# Patient Record
Sex: Male | Born: 1970 | Race: White | Hispanic: No | Marital: Married | Smoking: Never smoker
Health system: Southern US, Community
[De-identification: ages and names within clinical notes are randomized; demographics above are authoritative.]

## PROBLEM LIST (undated history)

## (undated) DIAGNOSIS — E119 Type 2 diabetes mellitus without complications: Secondary | ICD-10-CM

## (undated) DIAGNOSIS — J45909 Unspecified asthma, uncomplicated: Secondary | ICD-10-CM

---

## 2000-08-06 ENCOUNTER — Emergency Department (HOSPITAL_COMMUNITY): Admission: EM | Admit: 2000-08-06 | Discharge: 2000-08-06 | Payer: Self-pay | Admitting: Emergency Medicine

## 2000-08-06 ENCOUNTER — Encounter: Payer: Self-pay | Admitting: Emergency Medicine

## 2012-05-02 ENCOUNTER — Emergency Department (HOSPITAL_COMMUNITY)
Admission: EM | Admit: 2012-05-02 | Discharge: 2012-05-02 | Disposition: A | Discharge status: Home / Self Care | Payer: Worker's Compensation | Attending: Emergency Medicine | Admitting: Emergency Medicine

## 2012-05-02 ENCOUNTER — Emergency Department (HOSPITAL_COMMUNITY): Payer: Worker's Compensation

## 2012-05-02 ENCOUNTER — Encounter (HOSPITAL_COMMUNITY): Payer: Self-pay | Admitting: Emergency Medicine

## 2012-05-02 DIAGNOSIS — W010XXA Fall on same level from slipping, tripping and stumbling without subsequent striking against object, initial encounter: Secondary | ICD-10-CM | POA: Insufficient documentation

## 2012-05-02 DIAGNOSIS — Y9289 Other specified places as the place of occurrence of the external cause: Secondary | ICD-10-CM | POA: Insufficient documentation

## 2012-05-02 DIAGNOSIS — Y9389 Activity, other specified: Secondary | ICD-10-CM | POA: Insufficient documentation

## 2012-05-02 DIAGNOSIS — Y990 Civilian activity done for income or pay: Secondary | ICD-10-CM | POA: Insufficient documentation

## 2012-05-02 DIAGNOSIS — S0993XA Unspecified injury of face, initial encounter: Secondary | ICD-10-CM | POA: Insufficient documentation

## 2012-05-02 DIAGNOSIS — S0180XA Unspecified open wound of other part of head, initial encounter: Secondary | ICD-10-CM | POA: Insufficient documentation

## 2012-05-02 DIAGNOSIS — E119 Type 2 diabetes mellitus without complications: Secondary | ICD-10-CM | POA: Insufficient documentation

## 2012-05-02 DIAGNOSIS — J45909 Unspecified asthma, uncomplicated: Secondary | ICD-10-CM | POA: Insufficient documentation

## 2012-05-02 DIAGNOSIS — W1809XA Striking against other object with subsequent fall, initial encounter: Secondary | ICD-10-CM | POA: Insufficient documentation

## 2012-05-02 DIAGNOSIS — S79919A Unspecified injury of unspecified hip, initial encounter: Secondary | ICD-10-CM | POA: Insufficient documentation

## 2012-05-02 DIAGNOSIS — Z79899 Other long term (current) drug therapy: Secondary | ICD-10-CM | POA: Insufficient documentation

## 2012-05-02 HISTORY — DX: Type 2 diabetes mellitus without complications: E11.9

## 2012-05-02 HISTORY — DX: Unspecified asthma, uncomplicated: J45.909

## 2012-05-02 LAB — URINALYSIS, ROUTINE W REFLEX MICROSCOPIC
Bilirubin Urine: NEGATIVE
Glucose, UA: >1000 mg/dL — AB
Hgb urine dipstick: NEGATIVE
Ketones, ur: NEGATIVE mg/dL
pH: 5 (ref 5.0–8.0)

## 2012-05-02 LAB — RAPID URINE DRUG SCREEN, HOSP PERFORMED
Barbiturates: NOT DETECTED
Benzodiazepines: NOT DETECTED
Cocaine: NOT DETECTED
Tetrahydrocannabinol: NOT DETECTED

## 2012-05-02 LAB — CBC WITH DIFFERENTIAL/PLATELET
Basophils Absolute: 0.1 10*3/uL (ref 0.0–0.1)
Basophils Relative: 1 % (ref 0–1)
Eosinophils Absolute: 0.3 10*3/uL (ref 0.0–0.7)
Eosinophils Relative: 5 % (ref 0–5)
HCT: 40.5 % (ref 39.0–52.0)
Hemoglobin: 14 g/dL (ref 13.0–17.0)
Lymphocytes Relative: 38 % (ref 12–46)
Lymphs Abs: 2.7 10*3/uL (ref 0.7–4.0)
MCH: 31 pg (ref 26.0–34.0)
MCHC: 34.6 g/dL (ref 30.0–36.0)
MCV: 89.8 fL (ref 78.0–100.0)
Monocytes Absolute: 0.4 10*3/uL (ref 0.1–1.0)
Monocytes Relative: 6 % (ref 3–12)
Neutro Abs: 3.6 10*3/uL (ref 1.7–7.7)
Neutrophils Relative %: 51 % (ref 43–77)
Platelets: 216 10*3/uL (ref 150–400)
RBC: 4.51 MIL/uL (ref 4.22–5.81)
RDW: 12.9 % (ref 11.5–15.5)
WBC: 7.1 10*3/uL (ref 4.0–10.5)

## 2012-05-02 LAB — POCT I-STAT, CHEM 8
BUN: 11 mg/dL (ref 6–23)
Calcium, Ion: 1.24 mmol/L — ABNORMAL HIGH (ref 1.12–1.23)
Chloride: 106 mEq/L (ref 96–112)
Glucose, Bld: 117 mg/dL — ABNORMAL HIGH (ref 70–99)

## 2012-05-02 MED ORDER — LIDOCAINE HCL 2 % IJ SOLN
5.0000 mL | Freq: Once | INTRAMUSCULAR | Status: AC
  Administered 2012-05-02: 100 mg

## 2012-05-02 MED ORDER — HYDROCODONE-ACETAMINOPHEN 5-325 MG PO TABS
1.0000 | ORAL_TABLET | Freq: Once | ORAL | Status: AC
  Administered 2012-05-02: 1 via ORAL
  Filled 2012-05-02: qty 1

## 2012-05-02 MED ORDER — HYDROCODONE-ACETAMINOPHEN 5-325 MG PO TABS: 1.0000 | ORAL_TABLET | ORAL | Status: DC | PRN

## 2012-05-02 MED ORDER — ACETAMINOPHEN 500 MG PO TABS
1000.0000 mg | ORAL_TABLET | Freq: Once | ORAL | Status: AC
  Administered 2012-05-02: 1000 mg via ORAL
  Filled 2012-05-02: qty 2

## 2012-05-02 MED ORDER — SODIUM CHLORIDE 0.9 % IV SOLN
Freq: Once | INTRAVENOUS | Status: AC
  Administered 2012-05-02: 15:00:00 via INTRAVENOUS

## 2012-05-02 NOTE — ED Notes (Signed)
Workers comp information given to registration.

## 2012-05-02 NOTE — ED Notes (Signed)
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

## 2012-05-02 NOTE — ED Notes (Signed)
Suture cart at bedside; notified Dr Ignacia Palma that pts manager called and requested at urine drug screen for insurance purposes.

## 2012-05-02 NOTE — ED Provider Notes (Addendum)
History     CSN: [id number]  Arrival date & time 05/02/12  1507   None     Chief Complaint  Patient presents with  . Fall    (Consider location/radiation/quality/duration/timing/severity/associated sxs/prior treatment) Patient is a 42 y.o. male presenting with fall. The history is provided by the patient and the EMS personnel. No language interpreter was used.  Fall The accident occurred less than 1 hour ago. Fall occurred: Pt was standing on the bed of a truck, slipped because of wetness, fell, hitting forehead on a fork lift, suffering a laceration of the forehead.  He was ambulatory at the scene. He fell from a height of 3 to 5 ft. He landed on dirt. The volume of blood lost was minimal. The point of impact was the head. The pain is present in the head and left hip. The pain is moderate. He was ambulatory at the scene. There was no entrapment after the fall. There was no drug use involved in the accident. There was no alcohol use involved in the accident. Associated symptoms include headaches and loss of consciousness. Pertinent negatives include no fever, no numbness, no nausea, no vomiting and no tingling. Exacerbated by: Nothing. Treatment on scene includes a c-collar and a backboard. He has tried immobilization for the symptoms. The treatment provided no relief.    Past Medical History  Diagnosis Date  . Asthma   . Diabetes mellitus without complication     History reviewed. No pertinent past surgical history.  History reviewed. No pertinent family history.  History  Substance Use Topics  . Smoking status: Never Smoker   . Smokeless tobacco: Not on file  . Alcohol Use: No      Review of Systems  Constitutional: Negative for fever and chills.  HENT: Positive for neck pain.   Cardiovascular: Negative.   Gastrointestinal: Negative for nausea and vomiting.  Genitourinary: Negative.   Musculoskeletal:       Pain in left hip and left upper thigh.  Skin: Positive for  wound.  Neurological: Positive for loss of consciousness and headaches. Negative for tingling and numbness.  Psychiatric/Behavioral: Negative.     Allergies  Review of patient's allergies indicates no known allergies.  Home Medications  No current outpatient prescriptions on file.  There were no vitals taken for this visit.  Physical Exam  Nursing note and vitals reviewed. Constitutional: He is oriented to person, place, and time. He appears well-developed and well-nourished.  In mild-moderated distress, complaining of headache, neck pain, pain in left hip and left upper thigh.  Immobilized with C-collar and backboard.  HENT:  Right Ear: External ear normal.  Left Ear: External ear normal.  Mouth/Throat: Oropharynx is clear and moist.  He has a 2 cm laceration on the upper forehead in the midline that runs into the scalp.  No foreign body, no palpable deformity of the skull.  Eyes: Conjunctivae and EOM are normal. Pupils are equal, round, and reactive to light. No scleral icterus.  Neck: Normal range of motion. Neck supple.  No palpable deformity of the spine, no paraspinous muscle spasm.  Pt taken off the backboard and C-collar by me.  Cardiovascular: Normal rate, regular rhythm and normal heart sounds.   Pulmonary/Chest: Effort normal and breath sounds normal.  Abdominal: Soft. Bowel sounds are normal.  Musculoskeletal: Normal range of motion.  No bony defomity or tenderness over the thoracic or lumbosacral spine.  Pain localized to left upper thigh, but no bony deformity on inspection or palpation.  Neurological: He is alert and oriented to person, place, and time.  No sensory or motor deficit.  Skin: Skin is warm and dry.  Psychiatric: He has a normal mood and affect. His behavior is normal.    ED Course  LACERATION REPAIR Date/Time: 05/02/2012 6:06 PM Performed by: Osvaldo Human Authorized by: Osvaldo Human Consent: Verbal consent obtained. Risks and benefits:  risks, benefits and alternatives were discussed Consent given by: patient Patient understanding: patient states understanding of the procedure being performed Patient consent: the patient's understanding of the procedure matches consent given Site marked: the operative site was not marked Patient identity confirmed: verbally with patient Time out: Immediately prior to procedure a "time out" was called to verify the correct patient, procedure, equipment, support staff and site/side marked as required. Body area: head/neck Location details: forehead Laceration length: 2 cm Foreign bodies: no foreign bodies Tendon involvement: none Nerve involvement: none Vascular damage: no Anesthesia: local infiltration Local anesthetic: lidocaine 2% without epinephrine Patient sedated: no Preparation: Patient was prepped and draped in the usual sterile fashion. Irrigation solution: saline Irrigation method: tap Amount of cleaning: standard Debridement: none Degree of undermining: none Skin closure: 6-0 nylon Number of sutures: 6 Technique: simple Approximation: loose Approximation difficulty: simple Patient tolerance: Patient tolerated the procedure well with no immediate complications.   (including critical care time)  Labs Reviewed  CBC WITH DIFFERENTIAL  URINALYSIS, ROUTINE W REFLEX MICROSCOPIC   3:22 PM Pt was seen and had physical examination.  He requested po Tylenol.  Lab and x-rays ordered.  4:33 PM Results for orders placed during the hospital encounter of 05/02/12  CBC WITH DIFFERENTIAL      Result Value Range   WBC 7.1  4.0 - 10.5 K/uL   RBC 4.51  4.22 - 5.81 MIL/uL   Hemoglobin 14.0  13.0 - 17.0 g/dL   HCT 16.1  09.6 - 04.5 %   MCV 89.8  78.0 - 100.0 fL   MCH 31.0  26.0 - 34.0 pg   MCHC 34.6  30.0 - 36.0 g/dL   RDW 40.9  81.1 - 91.4 %   Platelets 216  150 - 400 K/uL   Neutrophils Relative 51  43 - 77 %   Neutro Abs 3.6  1.7 - 7.7 K/uL   Lymphocytes Relative 38  12 -  46 %   Lymphs Abs 2.7  0.7 - 4.0 K/uL   Monocytes Relative 6  3 - 12 %   Monocytes Absolute 0.4  0.1 - 1.0 K/uL   Eosinophils Relative 5  0 - 5 %   Eosinophils Absolute 0.3  0.0 - 0.7 K/uL   Basophils Relative 1  0 - 1 %   Basophils Absolute 0.1  0.0 - 0.1 K/uL  POCT I-STAT, CHEM 8      Result Value Range   Sodium 141  135 - 145 mEq/L   Potassium 4.0  3.5 - 5.1 mEq/L   Chloride 106  96 - 112 mEq/L   BUN 11  6 - 23 mg/dL   Creatinine, Ser 7.82  0.50 - 1.35 mg/dL   Glucose, Bld 956 (*) 70 - 99 mg/dL   Calcium, Ion 2.13 (*) 1.12 - 1.23 mmol/L   TCO2 25  0 - 100 mmol/L   Hemoglobin 13.9  13.0 - 17.0 g/dL   HCT 08.6  57.8 - 46.9 %   Dg Hip Complete Left  05/02/2012  *RADIOLOGY REPORT*  Clinical Data: Pain post fall  LEFT HIP -  COMPLETE 2+ VIEW  Comparison: None.  Findings: Three views of the left hip submitted.  No acute fracture or subluxation.  No radiopaque foreign body.  IMPRESSION: No acute fracture or subluxation.   Original Report Authenticated By: Natasha Mead, M.D.    Ct Head Wo Contrast  05/02/2012  *RADIOLOGY REPORT*  Clinical Data:  Fall from truck.  Transit loss of consciousness. Trauma to forehead.  CT HEAD WITHOUT CONTRAST CT CERVICAL SPINE WITHOUT CONTRAST  Technique:  Multidetector CT imaging of the head and cervical spine was performed following the standard protocol without intravenous contrast.  Multiplanar CT image reconstructions of the cervical spine were also generated.  Comparison:   None  CT HEAD  Findings: Soft tissue swelling and small laceration is present in the right to frontal scalp.  There is no underlying fracture.  No acute cortical infarct, hemorrhage, or mass lesion is present. The ventricles are of normal size.  No significant extra-axial fluid collection is present.  Mild mucosal thickening is scattered throughout the ethmoid air cells.  A fluid level is present in the maxillary sinuses bilaterally, more promptly on the left.  There are small fluid levels in  the frontal sinuses bilaterally.  Mastoid air cells are clear.  Mucosal thickening is present in the left sphenoid sinus.  IMPRESSION:  1.  Normal CT appearance of the brain. 2.  Right frontal scalp soft tissue swelling and laceration. 3.  No underlying fracture. 4.  Acute sinusitis.  CT CERVICAL SPINE  Findings: The cervical spine is imaged from skull base through T1- 2.  The vertebral body heights and alignment maintained.  No acute fracture or traumatic subluxation is evident.  There is straightening of the normal cervical lordosis.  The soft tissues are unremarkable.  The lung apices are clear.  IMPRESSION:  1.  No acute fracture or traumatic subluxation. 2.  Straightening of the normal cervical lordosis.  This is nonspecific, but can be seen in the setting of muscle strain or ongoing pain.   Original Report Authenticated By: Marin Roberts, M.D.    Ct Cervical Spine Wo Contrast  05/02/2012  *RADIOLOGY REPORT*  Clinical Data:  Fall from truck.  Transit loss of consciousness. Trauma to forehead.  CT HEAD WITHOUT CONTRAST CT CERVICAL SPINE WITHOUT CONTRAST  Technique:  Multidetector CT imaging of the head and cervical spine was performed following the standard protocol without intravenous contrast.  Multiplanar CT image reconstructions of the cervical spine were also generated.  Comparison:   None  CT HEAD  Findings: Soft tissue swelling and small laceration is present in the right to frontal scalp.  There is no underlying fracture.  No acute cortical infarct, hemorrhage, or mass lesion is present. The ventricles are of normal size.  No significant extra-axial fluid collection is present.  Mild mucosal thickening is scattered throughout the ethmoid air cells.  A fluid level is present in the maxillary sinuses bilaterally, more promptly on the left.  There are small fluid levels in the frontal sinuses bilaterally.  Mastoid air cells are clear.  Mucosal thickening is present in the left sphenoid sinus.   IMPRESSION:  1.  Normal CT appearance of the brain. 2.  Right frontal scalp soft tissue swelling and laceration. 3.  No underlying fracture. 4.  Acute sinusitis.  CT CERVICAL SPINE  Findings: The cervical spine is imaged from skull base through T1- 2.  The vertebral body heights and alignment maintained.  No acute fracture or traumatic subluxation is evident.  There is  straightening of the normal cervical lordosis.  The soft tissues are unremarkable.  The lung apices are clear.  IMPRESSION:  1.  No acute fracture or traumatic subluxation. 2.  Straightening of the normal cervical lordosis.  This is nonspecific, but can be seen in the setting of muscle strain or ongoing pain.   Original Report Authenticated By: Marin Roberts, M.D.    4:33 PM Lab and x-rays showed no serious injury.  Will suture his forehead laceration.   1. Fall   2. Laceration of forehead             Carleene Cooper III, MD 05/02/12 1511     Carleene Cooper III, MD 05/02/12 [phone]

## 2013-07-08 ENCOUNTER — Emergency Department (HOSPITAL_COMMUNITY)
Admission: EM | Admit: 2013-07-08 | Discharge: 2013-07-08 | Disposition: A | Discharge status: Home / Self Care | Payer: Worker's Compensation | Attending: Emergency Medicine | Admitting: Emergency Medicine

## 2013-07-08 ENCOUNTER — Emergency Department (HOSPITAL_COMMUNITY): Payer: Worker's Compensation

## 2013-07-08 ENCOUNTER — Encounter (HOSPITAL_COMMUNITY): Payer: Self-pay | Admitting: Emergency Medicine

## 2013-07-08 DIAGNOSIS — W1789XA Other fall from one level to another, initial encounter: Secondary | ICD-10-CM | POA: Insufficient documentation

## 2013-07-08 DIAGNOSIS — R209 Unspecified disturbances of skin sensation: Secondary | ICD-10-CM | POA: Insufficient documentation

## 2013-07-08 DIAGNOSIS — IMO0002 Reserved for concepts with insufficient information to code with codable children: Secondary | ICD-10-CM | POA: Insufficient documentation

## 2013-07-08 DIAGNOSIS — Y990 Civilian activity done for income or pay: Secondary | ICD-10-CM | POA: Insufficient documentation

## 2013-07-08 DIAGNOSIS — J45909 Unspecified asthma, uncomplicated: Secondary | ICD-10-CM | POA: Insufficient documentation

## 2013-07-08 DIAGNOSIS — Y9389 Activity, other specified: Secondary | ICD-10-CM | POA: Insufficient documentation

## 2013-07-08 DIAGNOSIS — S139XXA Sprain of joints and ligaments of unspecified parts of neck, initial encounter: Secondary | ICD-10-CM | POA: Insufficient documentation

## 2013-07-08 DIAGNOSIS — E119 Type 2 diabetes mellitus without complications: Secondary | ICD-10-CM | POA: Insufficient documentation

## 2013-07-08 DIAGNOSIS — W1809XA Striking against other object with subsequent fall, initial encounter: Secondary | ICD-10-CM | POA: Insufficient documentation

## 2013-07-08 DIAGNOSIS — S161XXA Strain of muscle, fascia and tendon at neck level, initial encounter: Secondary | ICD-10-CM

## 2013-07-08 DIAGNOSIS — Z79899 Other long term (current) drug therapy: Secondary | ICD-10-CM | POA: Insufficient documentation

## 2013-07-08 DIAGNOSIS — W19XXXA Unspecified fall, initial encounter: Secondary | ICD-10-CM

## 2013-07-08 DIAGNOSIS — S46912A Strain of unspecified muscle, fascia and tendon at shoulder and upper arm level, left arm, initial encounter: Secondary | ICD-10-CM

## 2013-07-08 DIAGNOSIS — Y9289 Other specified places as the place of occurrence of the external cause: Secondary | ICD-10-CM | POA: Insufficient documentation

## 2013-07-08 MED ORDER — DIAZEPAM 5 MG PO TABS: 5.0000 mg | ORAL_TABLET | Freq: Two times a day (BID) | ORAL | Status: DC

## 2013-07-08 MED ORDER — OXYCODONE-ACETAMINOPHEN 5-325 MG PO TABS
1.0000 | ORAL_TABLET | Freq: Once | ORAL | Status: AC
  Administered 2013-07-08: 1 via ORAL
  Filled 2013-07-08: qty 1

## 2013-07-08 MED ORDER — HYDROCODONE-ACETAMINOPHEN 5-325 MG PO TABS: 1.0000 | ORAL_TABLET | ORAL | Status: DC | PRN

## 2013-07-08 MED ORDER — IBUPROFEN 800 MG PO TABS: 800.0000 mg | ORAL_TABLET | Freq: Three times a day (TID) | ORAL | Status: AC

## 2013-07-08 NOTE — Discharge Instructions (Signed)
Take Vicodin for severe pain only. No driving or operating heavy machinery while taking vicodin. This medication may cause drowsiness. Take Valium as needed as directed for muscle spasm. No driving or operating heavy machinery while taking valium. This medication may cause drowsiness. Take ibuprofen as directed as needed for pain. Rest, avoid heavy lifting or hard physical activity for the next few days. Ice your neck and shoulder for the next 24 hours followed by heat thereafter, 15 minutes at a time.  Muscle Strain A muscle strain is an injury that occurs when a muscle is stretched beyond its normal length. Usually a small number of muscle fibers are torn when this happens. Muscle strain is rated in degrees. First-degree strains have the least amount of muscle fiber tearing and pain. Second-degree and third-degree strains have increasingly more tearing and pain.  Usually, recovery from muscle strain takes 1 2 weeks. Complete healing takes 5 6 weeks.  CAUSES  Muscle strain happens when a sudden, violent force placed on a muscle stretches it too far. This may occur with lifting, sports, or a fall.  RISK FACTORS Muscle strain is especially common in athletes.  SIGNS AND SYMPTOMS At the site of the muscle strain, there may be:  Pain.  Bruising.  Swelling.  Difficulty using the muscle due to pain or lack of normal function. DIAGNOSIS  Your health care provider will perform a physical exam and ask about your medical history. TREATMENT  Often, the best treatment for a muscle strain is resting, icing, and applying cold compresses to the injured area.  HOME CARE INSTRUCTIONS   Use the PRICE method of treatment to promote muscle healing during the first 2 3 days after your injury. The PRICE method involves:  Protecting the muscle from being injured again.  Restricting your activity and resting the injured body part.  Icing your injury. To do this, put ice in a plastic bag. Place a towel  between your skin and the bag. Then, apply the ice and leave it on from 15 20 minutes each hour. After the third day, switch to moist heat packs.  Apply compression to the injured area with a splint or elastic bandage. Be careful not to wrap it too tightly. This may interfere with blood circulation or increase swelling.  Elevate the injured body part above the level of your heart as often as you can.  Only take over-the-counter or prescription medicines for pain, discomfort, or fever as directed by your health care provider.  Warming up prior to exercise helps to prevent future muscle strains. SEEK MEDICAL CARE IF:   You have increasing pain or swelling in the injured area.  You have numbness, tingling, or a significant loss of strength in the injured area. MAKE SURE YOU:   Understand these instructions.  Will watch your condition.  Will get help right away if you are not doing well or get worse. Document Released: 02/26/2005 Document Revised: 12/17/2012 Document Reviewed: 09/25/2012 [facility] Patient Information 2014 [employer], [place].

## 2013-07-08 NOTE — ED Notes (Addendum)
Pt was standing on 3 feet high platform at work when the hose he was working with pulled him back and he fell hitting the back of his head and his left shoulder. Pt sts his neck hurts and pain radiates down his arm. Pt repots left hand is numb. Distal pulses palpable.

## 2013-07-08 NOTE — ED Provider Notes (Signed)
CSN: [id number]     Arrival date & time 07/08/13  1247 History  This chart was scribed for non-physician practitioner, [name], working with Hilario Quarryanielle S Ray, MD, by Tana ConchStephen Methvin ED Scribe. This patient was seen in [facility] and the patient's care was started at 12:56 PM.    Chief Complaint  Patient presents with  . Fall  . Neck Pain  . Shoulder Pain      Patient is a 43 y.o. male presenting with fall, neck pain, and shoulder pain. The history is provided by the patient. No language interpreter was used.  Fall  Neck Pain Associated symptoms: numbness (left hand)   Shoulder Pain    HPI Comments: William MoodBobby Wilkinson is a 43 y.o. 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 He was knocked off the the oil tank and now c/o neck pain, left shoulder pain. He states that the pain is a 10/10 and constant. He reports that his left hand is numb and that the numbness has "improved since he has been here". He denies LOC, visual disturbance, and HA   Past Medical History  Diagnosis Date  . Asthma   . Diabetes mellitus without complication    History reviewed. No pertinent past surgical history. History reviewed. No pertinent family history. History  Substance Use Topics  . Smoking status: Never Smoker   . Smokeless tobacco: Not on file  . Alcohol Use: No    Review of Systems  Musculoskeletal: Positive for arthralgias, joint swelling and neck pain.  Neurological: Positive for numbness (left hand).  All other systems reviewed and are negative.     Allergies  Review of patient's allergies indicates no known allergies.  Home Medications   Prior to Admission medications   Medication Sig Start Date End Date Taking? Authorizing Provider  Fluticasone-Salmeterol (ADVAIR) 250-50 MCG/DOSE AEPB Inhale 2 puffs into the lungs 2 (two) times daily.    Historical Provider, MD   HYDROcodone-acetaminophen (NORCO/VICODIN) 5-325 MG per tablet Take 1 tablet by mouth every 4 (four) hours as needed for pain. 05/02/12   Carleene CooperAlan Davidson III, MD  lisinopril (PRINIVIL,ZESTRIL) 5 MG tablet Take 5 mg by mouth daily.    Historical Provider, MD  metFORMIN (GLUCOPHAGE) 500 MG tablet Take 500 mg by mouth 2 (two) times daily with a meal.    Historical Provider, MD   BP 136/94  Pulse 101  Resp 22  Ht 6' (1.829 m)  Wt 260 lb (117.935 kg)  BMI 35.25 kg/m2  SpO2 98% Physical Exam  Nursing note and vitals reviewed. Constitutional: He is oriented to person, place, and time. He appears well-developed and well-nourished. No distress.  HENT:  Head: Normocephalic and atraumatic.  Eyes: Conjunctivae and EOM are normal. Pupils are equal, round, and reactive to light.  Neck: Neck supple.  C collar on   Cardiovascular: Normal rate, regular rhythm and normal heart sounds.   Pulmonary/Chest: Effort normal and breath sounds normal.  Musculoskeletal: He exhibits no edema.  Left shoulder  Tender palpation left shoulder, worse scapular spine Extension, abduction, flexion limited to 90 by pain No deformity. +2 radial pulse  Normal strength and sensation.  Neurological: He is alert and oriented to person, place, and time.  Skin: Skin is warm and dry.  Psychiatric: He has a normal Wilkinson and affect. His behavior is normal.    ED Course  Procedures (including critical care  time)  DIAGNOSTIC STUDIES: Oxygen Saturation is 98% on RA, normal by my interpretation.    COORDINATION OF CARE:  1:02 PM-Discussed treatment plan which includes XRAY, pain medication with pt at bedside and pt agreed to plan.      Labs Review Labs Reviewed - No data to display  Imaging Review Dg Cervical Spine Complete  07/08/2013   CLINICAL DATA:  Fall with left-sided neck and left shoulder pain.  EXAM: CERVICAL SPINE  4+ VIEWS  COMPARISON:  CT cervical spine 05/02/2012.  FINDINGS: The cervical spine is  visualized from the occiput to the cervicothoracic junction. There is straightening of the normal cervical lordosis without subluxation or fracture. Vertebral body height is maintained. Mild uncovertebral hypertrophy at C4-5, C5-6 and C6-7. Very slight loss of disc space height at C5-6 and C6-7. Multilevel facet sclerosis. Prevertebral soft tissues are within normal limits. Neural foramina appear grossly patent. Visualized lung apices show no acute findings.  IMPRESSION: Straightening of the normal cervical lordosis with mild spondylosis. No acute findings.   Electronically Signed   By: Leanna BattlesMelinda  Blietz M.D.   On: 07/08/2013 14:25   Dg Shoulder Left  07/08/2013   CLINICAL DATA:  Fall.  Left shoulder pain.  EXAM: LEFT SHOULDER - 2+ VIEW  COMPARISON:  None.  FINDINGS: There is no evidence of fracture or dislocation. There is no evidence of arthropathy or other focal bone abnormality. Soft tissues are unremarkable.  IMPRESSION: Negative.   Electronically Signed   By: Sebastian AcheAllen  Grady   On: 07/08/2013 14:23     EKG Interpretation None      MDM   Final diagnoses:  Fall  Neck strain  Left shoulder strain    Patient presenting with neck and left shoulder pain after fall. He is well appearing in no apparent distress. No tachycardia on my exam. Neurovascularly intact. X-ray of C-spine and left shoulder without any acute findings. C-collar removed, full range of motion, tenderness noted to cervical paraspinal muscles. Advised rest, ice/heat, NSAIDs. Will give short course of pain meds and muscle relaxer. Stable for discharge. Return precautions given. Patient states understanding of treatment care plan and is agreeable.   I personally performed the services described in this documentation, which was scribed in my presence. The recorded information has been reviewed and is accurate.    Trevor MaceRobyn M Albert, PA-C 07/08/13 1438

## 2013-07-09 NOTE — ED Provider Notes (Signed)
History/physical exam/procedure(s) were performed by non-physician practitioner and as supervising physician I was immediately available for consultation/collaboration. I have reviewed all notes and am in agreement with care and plan.   Hilario Quarryanielle S Nastasha Reising, MD 07/09/13 [phone]

## 2013-07-16 ENCOUNTER — Ambulatory Visit (INDEPENDENT_AMBULATORY_CARE_PROVIDER_SITE_OTHER): Payer: Worker's Compensation | Admitting: Sports Medicine

## 2013-07-16 ENCOUNTER — Encounter: Payer: Self-pay | Admitting: Sports Medicine

## 2013-07-16 VITALS — BP 143/86 | HR 87 | Ht 72.0 in | Wt 254.0 lb

## 2013-07-16 DIAGNOSIS — M5412 Radiculopathy, cervical region: Secondary | ICD-10-CM | POA: Insufficient documentation

## 2013-07-16 DIAGNOSIS — M7710 Lateral epicondylitis, unspecified elbow: Secondary | ICD-10-CM

## 2013-07-16 DIAGNOSIS — S139XXA Sprain of joints and ligaments of unspecified parts of neck, initial encounter: Secondary | ICD-10-CM

## 2013-07-16 DIAGNOSIS — S161XXA Strain of muscle, fascia and tendon at neck level, initial encounter: Secondary | ICD-10-CM

## 2013-07-16 DIAGNOSIS — M7712 Lateral epicondylitis, left elbow: Secondary | ICD-10-CM | POA: Insufficient documentation

## 2013-07-16 NOTE — Assessment & Plan Note (Signed)
50% improved with prednisone. Adding rehabilitation exercises, out of work until further evaluation. Return to see me in 2 weeks.

## 2013-07-16 NOTE — Assessment & Plan Note (Signed)
Likely exacerbated after the fall at work. Continue elbow brace, consider injection if no better.

## 2013-07-16 NOTE — Progress Notes (Signed)
Patient ID: William Wilkinson, male   DOB: [birth year], 43 y.o.   MRN: [id number]   Subjective:    I'm seeing this patient as a consultation for:  Dr. Margarita Grizzleanielle Ray with Emergency Medicine.   CC: Neck and left arm pain  HPI: Patient is a 43 year old man presenting for left sided neck and arm pain s/p fall on 4/29.  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  There was no LOC.  Immediately after the fall he had 10/10 pain on the left side of his neck that extended into his upper back.  He also experienced numbness of his left hand.  He was given valium and vicodin in the ED at the time of injury.  He has also been on a prednisone burst for the past 3 days, prescribed in UC here.  Today he endorses drastic improvement in his pain after steroid treatment.  His shoulder pain is gone completely and his neck pain is now a 5/10.  He still endorses left hand numbness and tingling, but it is now intermittent rather than constant and has improved greatly as well.  Valium and Vicodin previously prescribed do help him sleep at night, however he cannot take them during the day as they make him too drowsy.  He denies any worsening of symptoms.   Patient also has a history of lateral epicondylitis for which he has received injections in the past.  He would like another injection at a future appointment.   Past medical history, Surgical history, Family history not pertinant except as noted below, Social history, Allergies, and medications have been entered into the medical record, reviewed, and no changes needed.   Review of Systems: No headache, visual changes, nausea, vomiting, diarrhea, constipation, dizziness, abdominal pain, skin rash, fevers, chills, night sweats, weight loss, swollen lymph nodes, body aches, joint swelling, muscle aches, chest pain, shortness of breath, mood changes, visual or auditory hallucinations.   Objective:   General: Well Developed, well  nourished, and in no acute distress.  Neuro/Psych: Alert and oriented x3, extra-ocular muscles intact, able to move all 4 extremities, sensation grossly intact. Skin: Warm and dry, no rashes noted.  Respiratory: Not using accessory muscles, speaking in full sentences, trachea midline.  Cardiovascular: Pulses palpable, no extremity edema. Abdomen: Does not appear distended. Neck: Inspection unremarkable. No palpable stepoffs. Negative Spurling's maneuver. Full neck range of motion Grip strength and sensation normal in bilateral hands Strength good C4 to T1 distribution No sensory change to C4 to T1 Negative Hoffman sign bilaterally Reflexes normal Left Elbow: Unremarkable to inspection. Range of motion full pronation, supination, flexion, extension. Strength is full to all of the above directions Stable to varus, valgus stress. Negative moving valgus stress test. Lateral epicondyle tender to palpation  Ulnar nerve does not sublux. Negative cubital tunnel Tinel's.  Cervical spine x-rays show multilevel spondylosis worse at the C5-6 level.  Impression and Recommendations:   This case required medical decision making of moderate complexity.  Patient has an improving cervical strain after a work related accident on 4/29.  His neck pain and hand numbness are both associated with this injury and it is probable that the fall also exacerbated his tennis elbow.  Given that he has responded well to prednisone thus far we will continue his current regimen as previously prescribed.  - continue Prednisone taper - continue valium and vicodin prn - add PT home exercises - f/u 2 weeks

## 2013-07-30 ENCOUNTER — Encounter: Payer: Self-pay | Admitting: Sports Medicine

## 2013-07-30 ENCOUNTER — Ambulatory Visit (INDEPENDENT_AMBULATORY_CARE_PROVIDER_SITE_OTHER): Payer: PRIVATE HEALTH INSURANCE | Admitting: Sports Medicine

## 2013-07-30 VITALS — BP 118/80 | HR 69 | Ht 72.0 in | Wt 250.0 lb

## 2013-07-30 DIAGNOSIS — M7712 Lateral epicondylitis, left elbow: Secondary | ICD-10-CM

## 2013-07-30 DIAGNOSIS — S139XXA Sprain of joints and ligaments of unspecified parts of neck, initial encounter: Secondary | ICD-10-CM

## 2013-07-30 DIAGNOSIS — M7710 Lateral epicondylitis, unspecified elbow: Secondary | ICD-10-CM

## 2013-07-30 DIAGNOSIS — S161XXA Strain of muscle, fascia and tendon at neck level, initial encounter: Secondary | ICD-10-CM

## 2013-07-30 MED ORDER — HYDROCODONE-ACETAMINOPHEN 5-325 MG PO TABS: 1.0000 | ORAL_TABLET | ORAL | Status: DC | PRN

## 2013-07-30 MED ORDER — PREDNISONE (PAK) 10 MG PO TABS: ORAL_TABLET | ORAL | Status: DC

## 2013-07-30 NOTE — Assessment & Plan Note (Addendum)
Unfortunately pain is persistent. At this point we are going to obtain MRI. Return to see me go for MRI results. Additional course of prednisone taper.

## 2013-07-30 NOTE — Progress Notes (Signed)
  Subjective:    CC: Followup  HPI: Cervical strain: This occurred after a fall at work, he did have pain in his neck with radiation around the shoulder blade and numbness down his left arm. He has been through course of prednisone at home rehabilitation exercises but unfortunately is no better.  Left tennis elbow: Persistent, no better despite conservative measures and rehabilitation. Pain is localized over the common extensor tendon origin.  Past medical history, Surgical history, Family history not pertinant except as noted below, Social history, Allergies, and medications have been entered into the medical record, reviewed, and no changes needed.   Review of Systems: No fevers, chills, night sweats, weight loss, chest pain, or shortness of breath.   Objective:    General: Well Developed, well nourished, and in no acute distress.  Neuro: Alert and oriented x3, extra-ocular muscles intact, sensation grossly intact.  HEENT: Normocephalic, atraumatic, pupils equal round reactive to light, neck supple, no masses, no lymphadenopathy, thyroid nonpalpable.  Skin: Warm and dry, no rashes. Cardiac: Regular rate and rhythm, no murmurs rubs or gallops, no lower extremity edema.  Respiratory: Clear to auscultation bilaterally. Not using accessory muscles, speaking in full sentences. Left Elbow: Unremarkable to inspection. Range of motion full pronation, supination, flexion, extension. Strength is full to all of the above directions Stable to varus, valgus stress. Negative moving valgus stress test. Discrete tender to palpation over the common extensor tendon origin. Ulnar nerve does not sublux. Negative cubital tunnel Tinel's.  Procedure: Real-time Ultrasound Guided Injection of left common extensor tendon origin Device: GE Logiq E  Verbal informed consent obtained.  Time-out conducted.  Noted no overlying erythema, induration, or other signs of local infection.  Skin prepped in a sterile  fashion.  Local anesthesia: Topical Ethyl chloride.  With sterile technique and under real time ultrasound guidance:  Using a 25-gauge needle I percutaneously perforated the common extensor tendon at its origin multiple times, I did see several areas of calcification in the origin. I also injected a total of 1 cc Kenalog 40, 3 cc lidocaine. Completed without difficulty  Pain immediately resolved suggesting accurate placement of the medication.  Advised to call if fevers/chills, erythema, induration, drainage, or persistent bleeding.  Images permanently stored and available for review in the ultrasound unit.  Impression: Technically successful ultrasound guided injection.  The elbow was then strapped with compressive dressing.  Impression and Recommendations:

## 2013-07-30 NOTE — Assessment & Plan Note (Addendum)
Exacerbated by fall at work. Ultrasound guided percutaneous needle tenotomy as above. Return in one month for this.  Small amount of hydrocodone prescribed for postoperative pain. Strap with compressive dressing.

## 2013-08-12 ENCOUNTER — Encounter: Payer: Self-pay | Admitting: Sports Medicine

## 2013-08-13 ENCOUNTER — Ambulatory Visit (INDEPENDENT_AMBULATORY_CARE_PROVIDER_SITE_OTHER): Payer: Worker's Compensation | Admitting: Sports Medicine

## 2013-08-13 ENCOUNTER — Encounter: Payer: Self-pay | Admitting: Sports Medicine

## 2013-08-13 VITALS — BP 146/91 | HR 88 | Ht 72.0 in | Wt 250.0 lb

## 2013-08-13 DIAGNOSIS — M5412 Radiculopathy, cervical region: Secondary | ICD-10-CM

## 2013-08-13 MED ORDER — MELOXICAM 15 MG PO TABS: ORAL_TABLET | ORAL | Status: DC

## 2013-08-13 MED ORDER — TRAMADOL HCL 50 MG PO TABS: ORAL_TABLET | ORAL | Status: DC

## 2013-08-13 NOTE — Progress Notes (Signed)
  Subjective:    CC: Followup  HPI: Left lateral epicondylitis: Failed conservative measures, I performed a percutaneous needle tenotomy the last visit and he returns pain-free.  Cervical radiculitis: Left-sided C7 versus C8, MRI does confirm disc protrusion, pain is persistent despite conservative measures.  Past medical history, Surgical history, Family history not pertinant except as noted below, Social history, Allergies, and medications have been entered into the medical record, reviewed, and no changes needed.   Review of Systems: No fevers, chills, night sweats, weight loss, chest pain, or shortness of breath.   Objective:    General: Well Developed, well nourished, and in no acute distress.  Neuro: Alert and oriented x3, extra-ocular muscles intact, sensation grossly intact.  HEENT: Normocephalic, atraumatic, pupils equal round reactive to light, neck supple, no masses, no lymphadenopathy, thyroid nonpalpable.  Skin: Warm and dry, no rashes. Cardiac: Regular rate and rhythm, no murmurs rubs or gallops, no lower extremity edema.  Respiratory: Clear to auscultation bilaterally. Not using accessory muscles, speaking in full sentences.  MRI shows a C5-6 and C6-C7 disc protrusion with a left-sided foraminal component of both levels.  Impression and Recommendations:

## 2013-08-13 NOTE — Addendum Note (Signed)
Addended by: Monica Becton on: 08/13/2013 08:59 AM   Modules accepted: Orders, Medications

## 2013-08-13 NOTE — Assessment & Plan Note (Signed)
Clinically symptoms represent a left-sided C7 versus C8 radiculitis. MRI confirmed left-sided C5-6 and C6-C7 disc protrusion is likely affecting the foraminal nerve root. Symptoms have been present now for greater than a month, despite conservative measures. At this time we are going to proceed with a left-sided C6-C7 interlaminar epidural injection.  If the injection can be performed on the left at the C6-C7 level transforaminal this be even better. Return to see me 2 weeks after injection.

## 2013-08-20 ENCOUNTER — Encounter: Payer: Self-pay | Admitting: Sports Medicine

## 2013-08-21 ENCOUNTER — Ambulatory Visit
Admission: RE | Admit: 2013-08-21 | Discharge: 2013-08-21 | Disposition: A | Discharge status: Home / Self Care | Payer: Worker's Compensation | Source: Ambulatory Visit | Attending: Sports Medicine | Admitting: Sports Medicine

## 2013-08-21 VITALS — BP 152/82 | HR 74

## 2013-08-21 DIAGNOSIS — M5412 Radiculopathy, cervical region: Secondary | ICD-10-CM

## 2013-08-21 MED ORDER — TRIAMCINOLONE ACETONIDE 40 MG/ML IJ SUSP (RADIOLOGY)
60.0000 mg | Freq: Once | INTRAMUSCULAR | Status: AC
  Administered 2013-08-21: 60 mg via EPIDURAL

## 2013-08-21 MED ORDER — IOHEXOL 300 MG/ML  SOLN
1.0000 mL | Freq: Once | INTRAMUSCULAR | Status: AC | PRN
  Administered 2013-08-21: 1 mL via EPIDURAL

## 2013-08-21 NOTE — Discharge Instructions (Signed)

## 2013-09-04 ENCOUNTER — Ambulatory Visit (INDEPENDENT_AMBULATORY_CARE_PROVIDER_SITE_OTHER): Payer: Worker's Compensation | Admitting: Sports Medicine

## 2013-09-04 DIAGNOSIS — M5412 Radiculopathy, cervical region: Secondary | ICD-10-CM

## 2013-09-04 MED ORDER — GABAPENTIN 300 MG PO CAPS: ORAL_CAPSULE | ORAL | Status: DC

## 2013-09-04 NOTE — Progress Notes (Signed)
  Subjective:    CC: Followup after epidural  HPI: Cervical degenerative disc disease: Left-sided C8 radiculopathy with numbness and tingling radiating into the fourth and fifth fingers. He also has an MRI with C5-6 and C6-C7 degenerative changes. More recently we obtained a C6-C7 left-sided interlaminar epidural injection, he had no response, not even temporary to the injection. Unfortunately he has already failed physical therapy, steroids, NSAIDs, muscle relaxers. He is currently off work, and on pay for workers comp.  Past medical history, Surgical history, Family history not pertinant except as noted below, Social history, Allergies, and medications have been entered into the medical record, reviewed, and no changes needed.   Review of Systems: No fevers, chills, night sweats, weight loss, chest pain, or shortness of breath.   Objective:    General: Well Developed, well nourished, and in no acute distress.  Neuro: Alert and oriented x3, extra-ocular muscles intact, sensation grossly intact.  HEENT: Normocephalic, atraumatic, pupils equal round reactive to light, neck supple, no masses, no lymphadenopathy, thyroid nonpalpable.  Skin: Warm and dry, no rashes. Cardiac: Regular rate and rhythm, no murmurs rubs or gallops, no lower extremity edema.  Respiratory: Clear to auscultation bilaterally. Not using accessory muscles, speaking in full sentences.  Impression and Recommendations:

## 2013-09-04 NOTE — Assessment & Plan Note (Signed)
William Wilkinson has persistent left-sided C8 radicular symptoms with numbness and tingling into the fourth and fifth fingers, significant neck pain. MRI shows C5-6 and C6-C7 disc protrusions. He failed physical therapy, steroids, NSAIDs. Though he did have C8 radicular symptoms, we proceeded with a C6-C7 laminar epidural injection, he had no response, not even temporary. At this point I'm going to add gabapentin, and refer him downstairs to neurosurgery. He understands he needs to bring the MRI disc with him.

## 2013-09-08 ENCOUNTER — Telehealth: Payer: Self-pay

## 2013-09-08 DIAGNOSIS — M7712 Lateral epicondylitis, left elbow: Secondary | ICD-10-CM

## 2013-09-08 MED ORDER — HYDROCODONE-ACETAMINOPHEN 5-325 MG PO TABS
1.0000 | ORAL_TABLET | Freq: Three times a day (TID) | ORAL | Status: AC | PRN
Start: 2013-09-08 — End: ?

## 2013-09-08 NOTE — Telephone Encounter (Signed)
Patient request refill for Hydrocodone, he states that it is the only thing that helps him sleep. Rhonda Cunningham,CMA

## 2013-09-08 NOTE — Telephone Encounter (Signed)
Left message on patient vm advising him that Oxycodone Rx was ready to be picked up at the office. Rhonda Cunningham,CMA

## 2013-09-08 NOTE — Telephone Encounter (Signed)
Single refill of hydrocodone given. No further hydrocodone, this is not the right answer for his problem.

## 2013-10-05 ENCOUNTER — Telehealth: Payer: Self-pay

## 2013-10-05 NOTE — Telephone Encounter (Signed)
Patient request a Rx for Valium. William Wilkinson,CMA

## 2013-10-05 NOTE — Telephone Encounter (Signed)
Left message on patient vm to call me back regarding what his neurosurgeon is recommending on the Valium refill. Chevette Fee,CMA

## 2013-10-05 NOTE — Telephone Encounter (Signed)
Before we rx valium, I need to know what neurosurgery is recommending?

## 2013-10-06 NOTE — Telephone Encounter (Signed)
Spoke to patient he was told that he is going to have surgery and he would be notified but he has not heard anything from anybody, I advised patient to call neurosurgeon and follow up about what was going on. Kaylen Nghiem,CMA

## 2013-10-26 ENCOUNTER — Ambulatory Visit (INDEPENDENT_AMBULATORY_CARE_PROVIDER_SITE_OTHER): Payer: Worker's Compensation | Admitting: Sports Medicine

## 2013-10-26 ENCOUNTER — Encounter: Payer: Self-pay | Admitting: Sports Medicine

## 2013-10-26 VITALS — BP 133/87 | HR 96 | Ht 72.0 in | Wt 254.0 lb

## 2013-10-26 DIAGNOSIS — M5412 Radiculopathy, cervical region: Secondary | ICD-10-CM

## 2013-10-26 NOTE — Assessment & Plan Note (Signed)
William ClicheBobby has seen neurosurgery, it sounds as though they are planning discectomy and fusion. I am going to write a letter with some specific restrictions, once they have operated, further restrictions can be per neurosurgery.

## 2013-10-26 NOTE — Progress Notes (Signed)
  Subjective:    CC: Work restrictions   HPI: William Wilkinson returns, it sounds as though he is going to be scheduled for cervical ACDF, he has been off of work but needs his restrictions modified so that he may return to light duty.  Past medical history, Surgical history, Family history not pertinant except as noted below, Social history, Allergies, and medications have been entered into the medical record, reviewed, and no changes needed.   Review of Systems: No fevers, chills, night sweats, weight loss, chest pain, or shortness of breath.   Objective:    General: Well Developed, well nourished, and in no acute distress.  Neuro: Alert and oriented x3, extra-ocular muscles intact, sensation grossly intact.  HEENT: Normocephalic, atraumatic, pupils equal round reactive to light, neck supple, no masses, no lymphadenopathy, thyroid nonpalpable.  Skin: Warm and dry, no rashes. Cardiac: Regular rate and rhythm, no murmurs rubs or gallops, no lower extremity edema.  Respiratory: Clear to auscultation bilaterally. Not using accessory muscles, speaking in full sentences.  Impression and Recommendations:

## 2014-01-15 ENCOUNTER — Encounter: Payer: Self-pay | Admitting: Sports Medicine

## 2014-01-15 ENCOUNTER — Ambulatory Visit (INDEPENDENT_AMBULATORY_CARE_PROVIDER_SITE_OTHER): Payer: PRIVATE HEALTH INSURANCE | Admitting: Sports Medicine

## 2014-01-15 DIAGNOSIS — M5412 Radiculopathy, cervical region: Secondary | ICD-10-CM

## 2014-01-15 MED ORDER — DULOXETINE HCL 30 MG PO CPEP: 30.0000 mg | ORAL_CAPSULE | Freq: Every day | ORAL | Status: DC

## 2014-01-15 MED ORDER — TRAMADOL HCL 50 MG PO TABS: 100.0000 mg | ORAL_TABLET | Freq: Three times a day (TID) | ORAL | Status: AC | PRN

## 2014-01-15 MED ORDER — MELOXICAM 15 MG PO TABS: ORAL_TABLET | ORAL | Status: DC

## 2014-01-15 NOTE — Progress Notes (Signed)
  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 Injury and his hearing is in January 2016, and he needs help with managing his pain. Not using any NSAIDs, gabapentin is effective but makes him a bit drowsy. He understands that we will not be prescribing narcotics.  pain is radiating down the left arm.  Past medical history, Surgical history, Family history not pertinant except as noted below, Social history, Allergies, and medications have been entered into the medical record, reviewed, and no changes needed.   Review of Systems: No fevers, chills, night sweats, weight loss, chest pain, or shortness of breath.   Objective:    General: Well Developed, well nourished, and in no acute distress.  Neuro: Alert and oriented x3, extra-ocular muscles intact, sensation grossly intact.  HEENT: Normocephalic, atraumatic, pupils equal round reactive to light, neck supple, no masses, no lymphadenopathy, thyroid nonpalpable.  Skin: Warm and dry, no rashes. Cardiac: Regular rate and rhythm, no murmurs rubs or gallops, no lower extremity edema.  Respiratory: Clear to auscultation bilaterally. Not using accessory muscles, speaking in full sentences. Neck: Negative spurling's Full neck range of motion Grip strength and sensation normal in bilateral hands Strength good C4 to T1 distribution No sensory change to C4 to T1 Reflexes normal  Impression and Recommendations:

## 2014-01-15 NOTE — Assessment & Plan Note (Signed)
Insufficient response to epidurals. Anterior cervical discectomy and fusion is planned. Hearing is not until January so we do need to manage his pain until then. Max dose tramadol, meloxicam, and adding Cymbalta. Discontinue gabapentin, it has been ineffective. Next line return in one month.

## 2014-02-12 ENCOUNTER — Ambulatory Visit: Payer: Worker's Compensation | Admitting: Sports Medicine

## 2014-04-29 ENCOUNTER — Ambulatory Visit (INDEPENDENT_AMBULATORY_CARE_PROVIDER_SITE_OTHER): Payer: PRIVATE HEALTH INSURANCE | Admitting: Sports Medicine

## 2014-04-29 ENCOUNTER — Encounter: Payer: Self-pay | Admitting: Sports Medicine

## 2014-04-29 VITALS — BP 145/89 | HR 89 | Ht 72.0 in | Wt 251.0 lb

## 2014-04-29 DIAGNOSIS — M5412 Radiculopathy, cervical region: Secondary | ICD-10-CM

## 2014-04-29 NOTE — Progress Notes (Signed)
  Subjective:    CC: paperwork  HPI: Reita ClicheBobby returns, he has cervical degenerative disc disease, he has failed conservative measures but has not yet had his ACDF. He brings disability paperwork today.  Past medical history, Surgical history, Family history not pertinant except as noted below, Social history, Allergies, and medications have been entered into the medical record, reviewed, and no changes needed.   Review of Systems: No fevers, chills, night sweats, weight loss, chest pain, or shortness of breath.   Objective:    General: Well Developed, well nourished, and in no acute distress.  Neuro: Alert and oriented x3, extra-ocular muscles intact, sensation grossly intact.  HEENT: Normocephalic, atraumatic, pupils equal round reactive to light, neck supple, no masses, no lymphadenopathy, thyroid nonpalpable.  Skin: Warm and dry, no rashes. Cardiac: Regular rate and rhythm, no murmurs rubs or gallops, no lower extremity edema.  Respiratory: Clear to auscultation bilaterally. Not using accessory muscles, speaking in full sentences.  Impression and Recommendations:

## 2014-04-29 NOTE — Assessment & Plan Note (Signed)
Plan continues to be for ACDF. Paperwork filled out today, patient is not certified for disability.

## 2014-05-31 ENCOUNTER — Ambulatory Visit (INDEPENDENT_AMBULATORY_CARE_PROVIDER_SITE_OTHER): Payer: PRIVATE HEALTH INSURANCE | Admitting: Sports Medicine

## 2014-05-31 ENCOUNTER — Encounter: Payer: Self-pay | Admitting: Sports Medicine

## 2014-05-31 VITALS — BP 122/82 | HR 79 | Ht 72.0 in | Wt 249.0 lb

## 2014-05-31 DIAGNOSIS — M7712 Lateral epicondylitis, left elbow: Secondary | ICD-10-CM

## 2014-05-31 NOTE — Assessment & Plan Note (Signed)
Nine-month response to a previous left percutaneous needle tenotomy of the common extensor tendon of the elbow. Repeat injection, this time tendon sheath injection. Return to full duty. Return in one month to evaluate response, and then return as needed.

## 2014-05-31 NOTE — Progress Notes (Signed)
  Subjective:    CC: Recurrent pain  HPI: William Wilkinson returns, he has pain in his left elbow, we diagnosed him with tennis elbow in a previous visit and did a percutaneous needle tenotomy, this provided 9 months of response and he returns today with a recurrence of pain. Moderate, persistent, localized at the common extensor tendon origin.  Past medical history, Surgical history, Family history not pertinant except as noted below, Social history, Allergies, and medications have been entered into the medical record, reviewed, and no changes needed.   Review of Systems: No fevers, chills, night sweats, weight loss, chest pain, or shortness of breath.   Objective:    General: Well Developed, well nourished, and in no acute distress.  Neuro: Alert and oriented x3, extra-ocular muscles intact, sensation grossly intact.  HEENT: Normocephalic, atraumatic, pupils equal round reactive to light, neck supple, no masses, no lymphadenopathy, thyroid nonpalpable.  Skin: Warm and dry, no rashes. Cardiac: Regular rate and rhythm, no murmurs rubs or gallops, no lower extremity edema.  Respiratory: Clear to auscultation bilaterally. Not using accessory muscles, speaking in full sentences. Left Elbow: Unremarkable to inspection. Range of motion full pronation, supination, flexion, extension. Strength is full to all of the above directions Stable to varus, valgus stress. Negative moving valgus stress test. Tender to palpation at the common extensor tendon origin Ulnar nerve does not sublux. Negative cubital tunnel Tinel's.  Procedure: Real-time Ultrasound Guided Injection of left common extensor tendon origin Device: GE Logiq E  Verbal informed consent obtained.  Time-out conducted.  Noted no overlying erythema, induration, or other signs of local infection.  Skin prepped in a sterile fashion.  Local anesthesia: Topical Ethyl chloride.  With sterile technique and under real time ultrasound guidance:  Using  a total of 1 mL Kenalog 40 and 4 mL lidocaine injected medicine both superficial to and deep to the common extensor tendon origin at the lateral epicondyle. Completed without difficulty  Pain immediately resolved suggesting accurate placement of the medication.  Advised to call if fevers/chills, erythema, induration, drainage, or persistent bleeding.  Images permanently stored and available for review in the ultrasound unit.  Impression: Technically successful ultrasound guided injection.  Impression and Recommendations:

## 2014-07-02 ENCOUNTER — Ambulatory Visit: Payer: Worker's Compensation | Admitting: Sports Medicine

## 2014-11-15 IMAGING — CR DG SHOULDER 2+V*L*
3 series · 3 of 3 positions shown · non-contrast
Comparison: None.

CLINICAL DATA: Fall.  Left shoulder pain.

EXAM:
LEFT SHOULDER - 2+ VIEW

[w shoulder internal left]
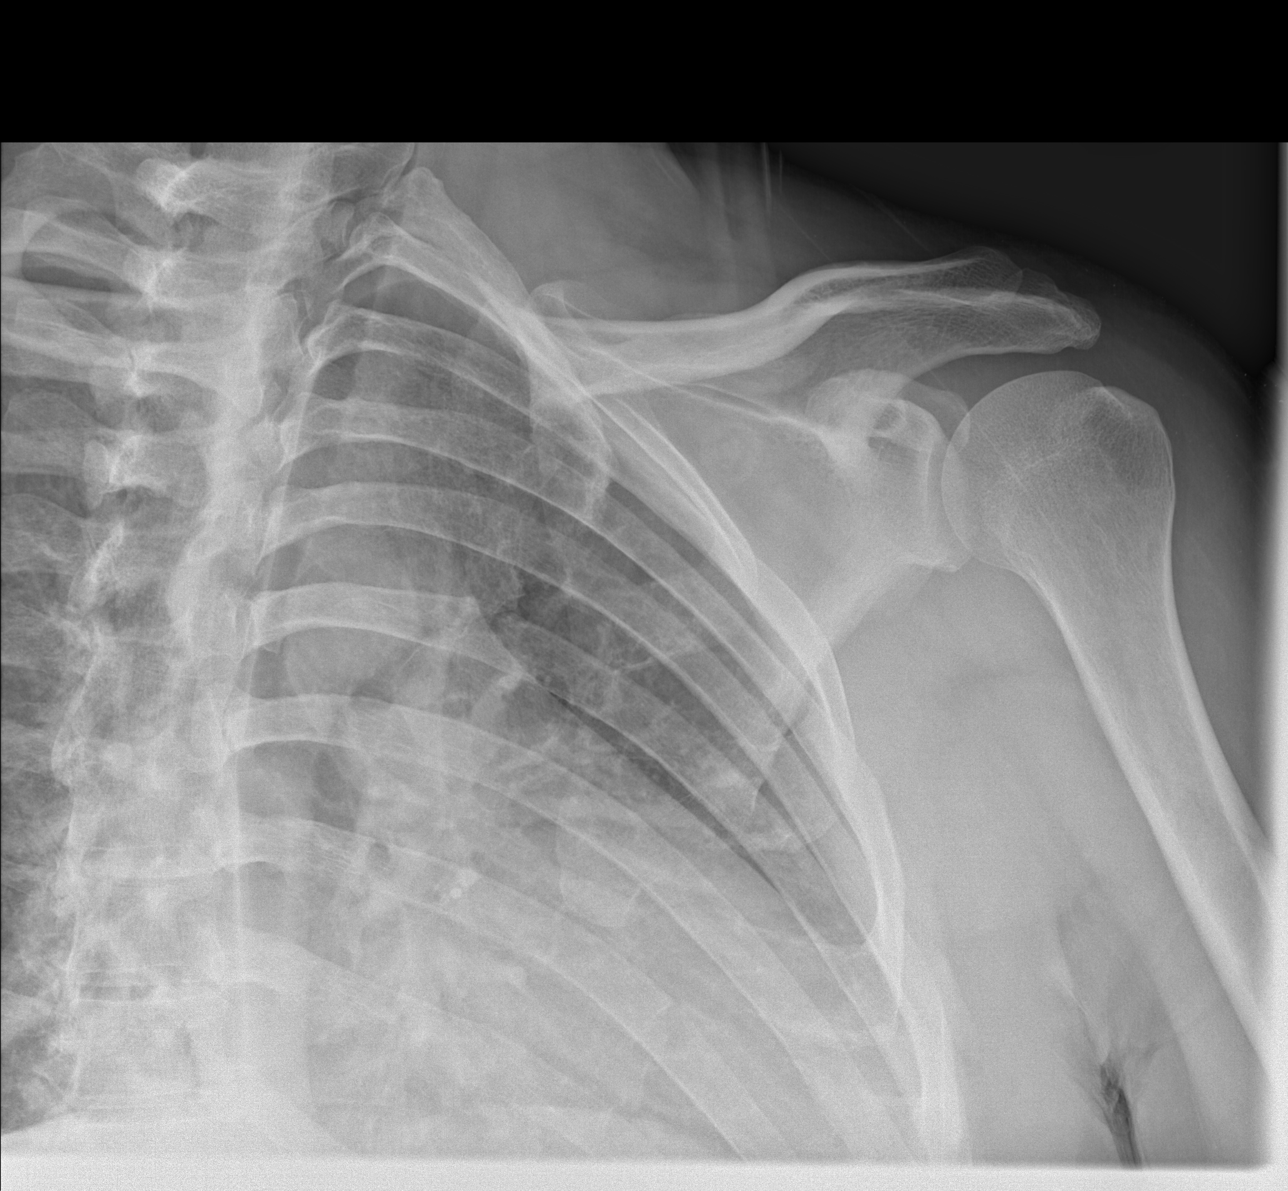

[w shoulder y-view left (1 of 2)]
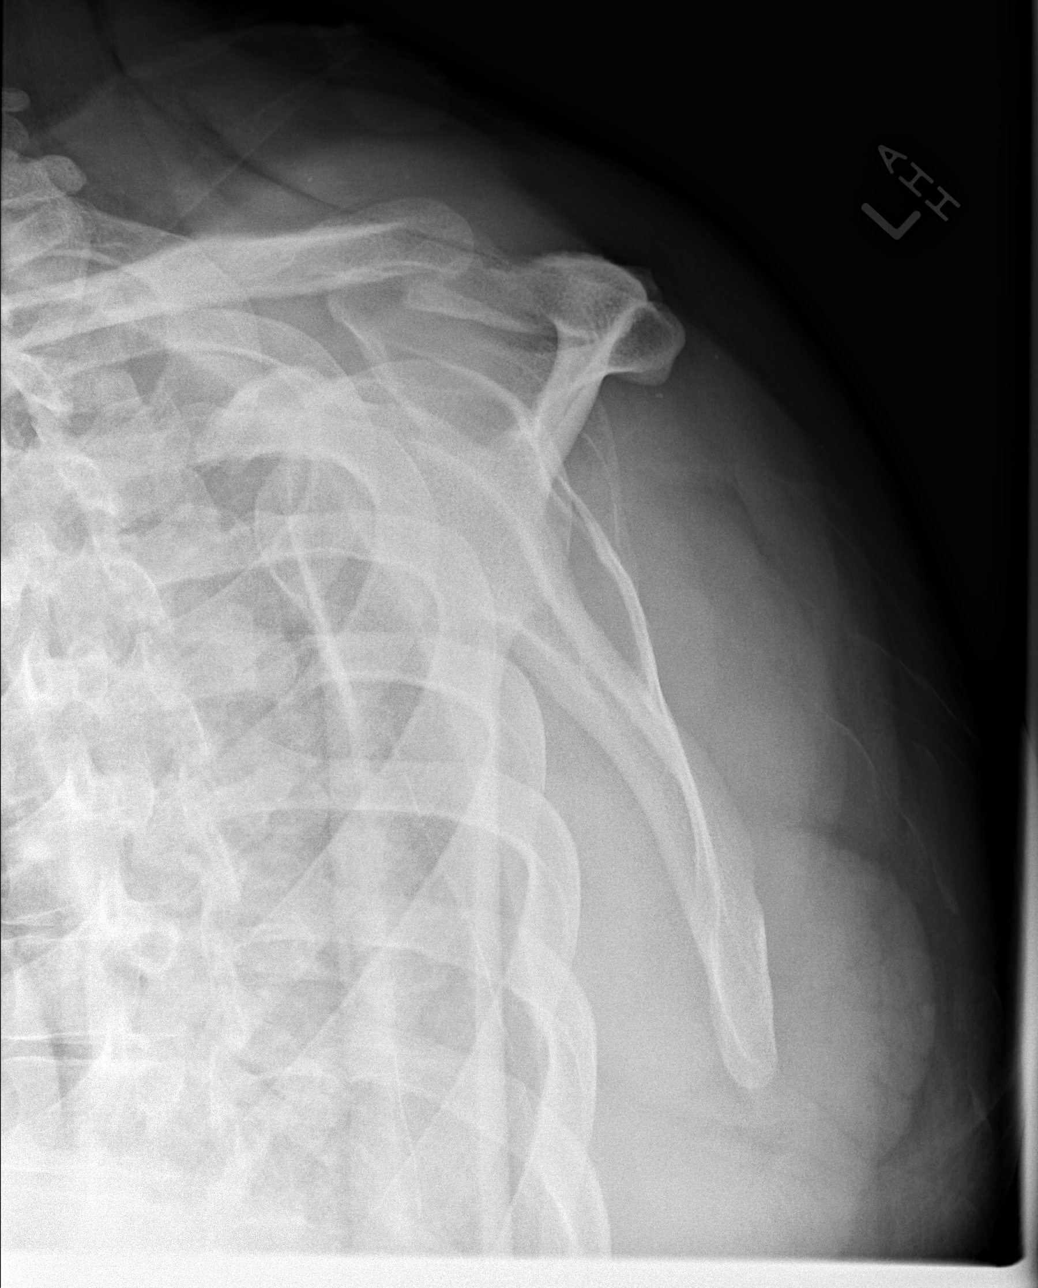

[w shoulder y-view left (2 of 2)]
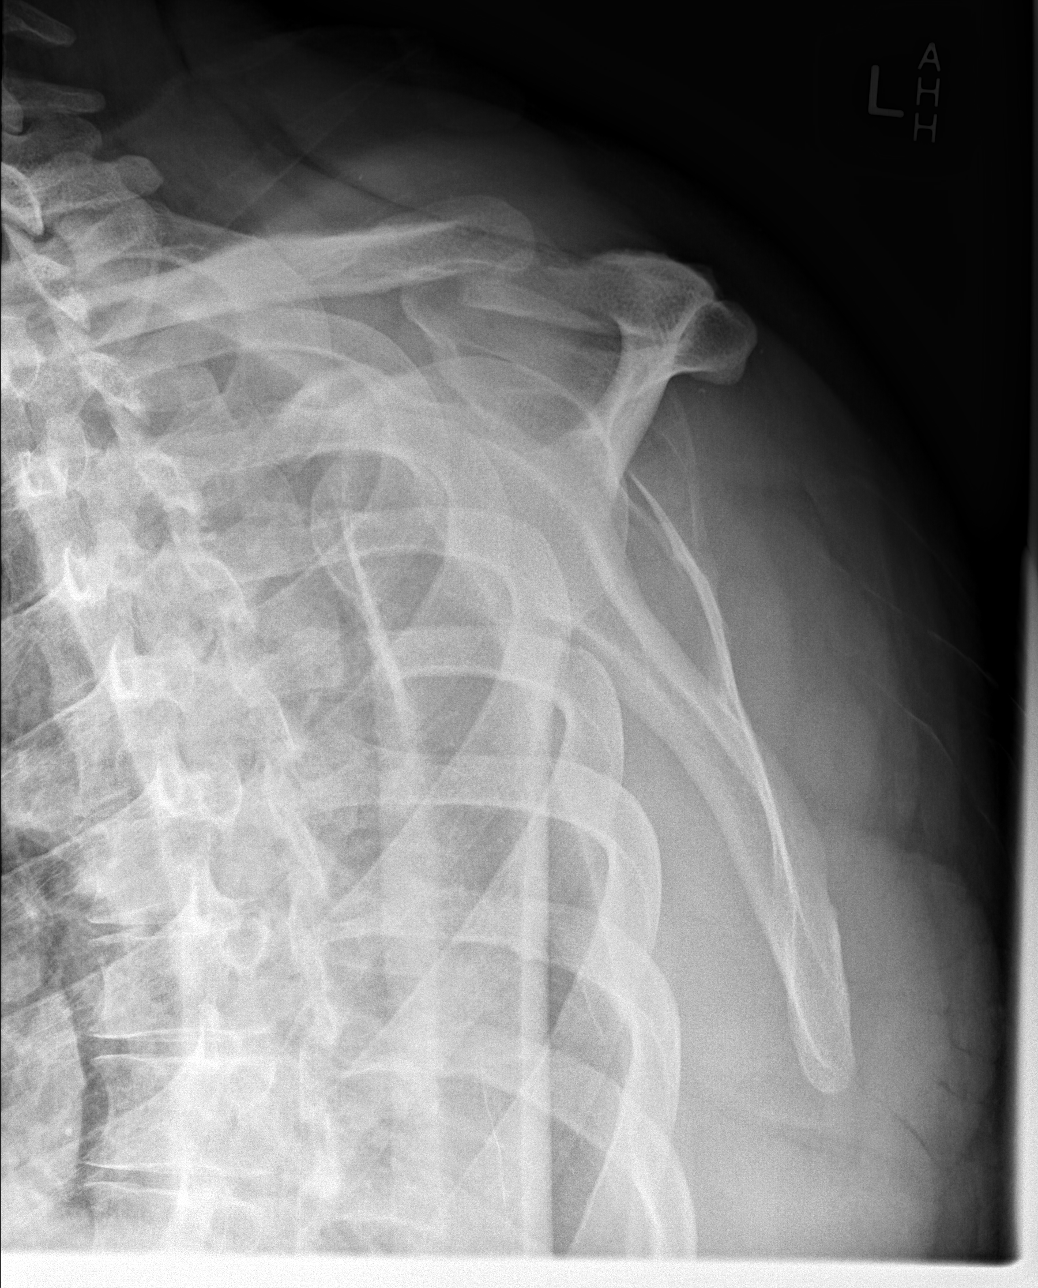

[3 of 3 positions shown; findings below may reference images not displayed]

FINDINGS: There is no evidence of fracture or dislocation. There is no
evidence of arthropathy or other focal bone abnormality. Soft
tissues are unremarkable.
IMPRESSION: Negative.
